# Patient Record
Sex: Female | Born: 1935 | Race: White | Hispanic: No | Smoking: Never smoker
Health system: Southern US, Community
[De-identification: ages and names within clinical notes are randomized; demographics above are authoritative.]

## PROBLEM LIST (undated history)

## (undated) DIAGNOSIS — K219 Gastro-esophageal reflux disease without esophagitis: Secondary | ICD-10-CM

## (undated) DIAGNOSIS — F0390 Unspecified dementia without behavioral disturbance: Secondary | ICD-10-CM

## (undated) DIAGNOSIS — I10 Essential (primary) hypertension: Secondary | ICD-10-CM

## (undated) DIAGNOSIS — E785 Hyperlipidemia, unspecified: Secondary | ICD-10-CM

## (undated) DIAGNOSIS — E079 Disorder of thyroid, unspecified: Secondary | ICD-10-CM

## (undated) HISTORY — PX: ABDOMINAL HYSTERECTOMY: SHX81

## (undated) HISTORY — PX: BACK SURGERY: SHX140

## (undated) HISTORY — PX: APPENDECTOMY: SHX54

## (undated) HISTORY — PX: ANKLE FUSION: SHX881

---

## 1997-10-02 ENCOUNTER — Ambulatory Visit (HOSPITAL_COMMUNITY): Admission: RE | Admit: 1997-10-02 | Discharge: 1997-10-02 | Payer: Self-pay | Admitting: *Deleted

## 1997-10-14 ENCOUNTER — Other Ambulatory Visit: Admission: RE | Admit: 1997-10-14 | Discharge: 1997-10-14 | Payer: Self-pay | Admitting: *Deleted

## 1997-10-14 ENCOUNTER — Ambulatory Visit (HOSPITAL_COMMUNITY): Admission: RE | Admit: 1997-10-14 | Discharge: 1997-10-14 | Payer: Self-pay | Admitting: *Deleted

## 1997-11-18 ENCOUNTER — Other Ambulatory Visit: Admission: RE | Admit: 1997-11-18 | Discharge: 1997-11-18 | Payer: Self-pay | Admitting: *Deleted

## 1998-01-14 ENCOUNTER — Ambulatory Visit (HOSPITAL_COMMUNITY): Admission: RE | Admit: 1998-01-14 | Discharge: 1998-01-14 | Payer: Self-pay | Admitting: Gastroenterology

## 1998-09-07 ENCOUNTER — Other Ambulatory Visit: Admission: RE | Admit: 1998-09-07 | Discharge: 1998-09-07 | Payer: Self-pay | Admitting: *Deleted

## 1998-11-02 ENCOUNTER — Ambulatory Visit (HOSPITAL_COMMUNITY): Admission: RE | Admit: 1998-11-02 | Discharge: 1998-11-02 | Payer: Self-pay | Admitting: *Deleted

## 1998-11-09 ENCOUNTER — Other Ambulatory Visit: Admission: RE | Admit: 1998-11-09 | Discharge: 1998-11-09 | Payer: Self-pay | Admitting: *Deleted

## 1998-11-11 ENCOUNTER — Ambulatory Visit (HOSPITAL_BASED_OUTPATIENT_CLINIC_OR_DEPARTMENT_OTHER): Admission: RE | Admit: 1998-11-11 | Discharge: 1998-11-11 | Payer: Self-pay | Admitting: Plastic Surgery

## 1999-02-18 ENCOUNTER — Ambulatory Visit (HOSPITAL_COMMUNITY): Admission: RE | Admit: 1999-02-18 | Discharge: 1999-02-18 | Payer: Self-pay | Admitting: *Deleted

## 1999-07-26 ENCOUNTER — Encounter: Payer: Self-pay | Admitting: Emergency Medicine

## 1999-07-26 ENCOUNTER — Emergency Department (HOSPITAL_COMMUNITY): Admission: EM | Admit: 1999-07-26 | Discharge: 1999-07-26 | Payer: Self-pay | Admitting: Emergency Medicine

## 1999-08-10 ENCOUNTER — Encounter: Admission: RE | Admit: 1999-08-10 | Discharge: 1999-08-10 | Payer: Self-pay | Admitting: Gastroenterology

## 1999-08-10 ENCOUNTER — Encounter: Payer: Self-pay | Admitting: Gastroenterology

## 1999-11-03 ENCOUNTER — Ambulatory Visit (HOSPITAL_COMMUNITY): Admission: RE | Admit: 1999-11-03 | Discharge: 1999-11-03 | Payer: Self-pay | Admitting: *Deleted

## 1999-11-09 ENCOUNTER — Other Ambulatory Visit: Admission: RE | Admit: 1999-11-09 | Discharge: 1999-11-09 | Payer: Self-pay | Admitting: *Deleted

## 1999-11-15 ENCOUNTER — Ambulatory Visit (HOSPITAL_COMMUNITY): Admission: RE | Admit: 1999-11-15 | Discharge: 1999-11-15 | Payer: Self-pay | Admitting: *Deleted

## 2000-11-08 ENCOUNTER — Ambulatory Visit (HOSPITAL_COMMUNITY): Admission: RE | Admit: 2000-11-08 | Discharge: 2000-11-08 | Payer: Self-pay | Admitting: Internal Medicine

## 2000-11-08 ENCOUNTER — Encounter: Payer: Self-pay | Admitting: Internal Medicine

## 2001-01-02 ENCOUNTER — Ambulatory Visit (HOSPITAL_COMMUNITY): Admission: RE | Admit: 2001-01-02 | Discharge: 2001-01-02 | Payer: Self-pay | Admitting: Internal Medicine

## 2001-01-02 ENCOUNTER — Encounter: Payer: Self-pay | Admitting: Internal Medicine

## 2001-11-12 ENCOUNTER — Ambulatory Visit (HOSPITAL_COMMUNITY): Admission: RE | Admit: 2001-11-12 | Discharge: 2001-11-12 | Payer: Self-pay | Admitting: Internal Medicine

## 2001-11-12 ENCOUNTER — Encounter: Payer: Self-pay | Admitting: Internal Medicine

## 2002-11-14 ENCOUNTER — Encounter: Payer: Self-pay | Admitting: Internal Medicine

## 2002-11-14 ENCOUNTER — Ambulatory Visit (HOSPITAL_COMMUNITY): Admission: RE | Admit: 2002-11-14 | Discharge: 2002-11-14 | Payer: Self-pay | Admitting: Internal Medicine

## 2003-11-18 ENCOUNTER — Ambulatory Visit (HOSPITAL_COMMUNITY): Admission: RE | Admit: 2003-11-18 | Discharge: 2003-11-18 | Payer: Self-pay | Admitting: Internal Medicine

## 2004-06-15 ENCOUNTER — Other Ambulatory Visit: Admission: RE | Admit: 2004-06-15 | Discharge: 2004-06-15 | Payer: Self-pay | Admitting: Internal Medicine

## 2004-11-18 ENCOUNTER — Ambulatory Visit (HOSPITAL_COMMUNITY): Admission: RE | Admit: 2004-11-18 | Discharge: 2004-11-18 | Payer: Self-pay | Admitting: Internal Medicine

## 2005-01-20 ENCOUNTER — Ambulatory Visit: Admission: RE | Admit: 2005-01-20 | Discharge: 2005-01-20 | Payer: Self-pay | Admitting: Urology

## 2005-03-29 ENCOUNTER — Inpatient Hospital Stay (HOSPITAL_COMMUNITY): Admission: RE | Admit: 2005-03-29 | Discharge: 2005-03-30 | Payer: Self-pay | Admitting: Urology

## 2005-11-21 ENCOUNTER — Ambulatory Visit (HOSPITAL_COMMUNITY): Admission: RE | Admit: 2005-11-21 | Discharge: 2005-11-21 | Payer: Self-pay | Admitting: Internal Medicine

## 2006-04-06 ENCOUNTER — Inpatient Hospital Stay (HOSPITAL_COMMUNITY): Admission: EM | Admit: 2006-04-06 | Discharge: 2006-04-11 | Payer: Self-pay

## 2006-04-11 ENCOUNTER — Ambulatory Visit: Payer: Self-pay | Admitting: Physical Medicine & Rehabilitation

## 2006-04-11 ENCOUNTER — Inpatient Hospital Stay (HOSPITAL_COMMUNITY)
Admission: RE | Admit: 2006-04-11 | Discharge: 2006-05-05 | Payer: Self-pay | Admitting: Physical Medicine & Rehabilitation

## 2006-04-12 ENCOUNTER — Encounter (INDEPENDENT_AMBULATORY_CARE_PROVIDER_SITE_OTHER): Payer: Self-pay | Admitting: Cardiology

## 2006-07-13 ENCOUNTER — Ambulatory Visit (HOSPITAL_COMMUNITY): Admission: RE | Admit: 2006-07-13 | Discharge: 2006-07-13 | Payer: Self-pay | Admitting: Geriatric Medicine

## 2006-07-13 ENCOUNTER — Encounter (INDEPENDENT_AMBULATORY_CARE_PROVIDER_SITE_OTHER): Payer: Self-pay | Admitting: *Deleted

## 2006-11-06 ENCOUNTER — Inpatient Hospital Stay (HOSPITAL_COMMUNITY): Admission: EM | Admit: 2006-11-06 | Discharge: 2006-11-10 | Payer: Self-pay | Admitting: Emergency Medicine

## 2006-11-07 ENCOUNTER — Encounter (INDEPENDENT_AMBULATORY_CARE_PROVIDER_SITE_OTHER): Payer: Self-pay | Admitting: Cardiology

## 2007-01-10 ENCOUNTER — Encounter: Admission: RE | Admit: 2007-01-10 | Discharge: 2007-01-10 | Payer: Self-pay | Admitting: Geriatric Medicine

## 2007-01-18 ENCOUNTER — Inpatient Hospital Stay (HOSPITAL_COMMUNITY): Admission: RE | Admit: 2007-01-18 | Discharge: 2007-01-22 | Payer: Self-pay | Admitting: Orthopedic Surgery

## 2007-02-22 ENCOUNTER — Ambulatory Visit (HOSPITAL_COMMUNITY): Admission: RE | Admit: 2007-02-22 | Discharge: 2007-02-22 | Payer: Self-pay | Admitting: Gastroenterology

## 2008-01-16 ENCOUNTER — Encounter: Admission: RE | Admit: 2008-01-16 | Discharge: 2008-01-16 | Payer: Self-pay | Admitting: Geriatric Medicine

## 2009-08-04 ENCOUNTER — Encounter: Admission: RE | Admit: 2009-08-04 | Discharge: 2009-08-04 | Payer: Self-pay | Admitting: Geriatric Medicine

## 2010-05-16 ENCOUNTER — Encounter: Payer: Self-pay | Admitting: Geriatric Medicine

## 2010-07-13 ENCOUNTER — Other Ambulatory Visit: Payer: Self-pay | Admitting: Geriatric Medicine

## 2010-07-13 DIAGNOSIS — Z1231 Encounter for screening mammogram for malignant neoplasm of breast: Secondary | ICD-10-CM

## 2010-08-06 ENCOUNTER — Ambulatory Visit
Admission: RE | Admit: 2010-08-06 | Discharge: 2010-08-06 | Disposition: A | Discharge status: Home / Self Care | Payer: Medicare Other | Source: Ambulatory Visit | Attending: Geriatric Medicine | Admitting: Geriatric Medicine

## 2010-08-06 DIAGNOSIS — Z1231 Encounter for screening mammogram for malignant neoplasm of breast: Secondary | ICD-10-CM

## 2010-09-07 NOTE — Op Note (Signed)
NAMEJOELLYN, GRANDT NO.:  [id number]   MEDICAL RECORD NO.:  [id number]          PATIENT TYPE:  INP   LOCATION:  1618                         FACILITY:  [facility]   PHYSICIAN:  Leonides Grills, M.D.     DATE OF BIRTH:  09-06-1927   DATE OF PROCEDURE:  01/18/2007  DATE OF DISCHARGE:                               OPERATIVE REPORT   PREOPERATIVE DIAGNOSES:  1. Left ankle arthritis.  2. Left tight Achilles tendon.   POSTOPERATIVE DIAGNOSES:  1. Left ankle arthritis.  2. Left tight Achilles tendon.   OPERATION:  1. Left ankle fusion.  2. Left fibular osteotomy.  3. Left local bone graft.  4. Left percutaneous tendo Achilles lengthening.   ANESTHESIA:  General.   SURGEON:  Leonides Grills, M.D.   ASSISTANT:  Evlyn Kanner, P.A.   ESTIMATED BLOOD LOSS:  Minimal.   TOURNIQUET TIME:  Approximately hour and a half.   COMPLICATIONS:  None.   DISPOSITION:  Stable to PR.   INDICATIONS:  This is a 77 year old female who has had longstanding left  ankle pain with equinus deformity that is interfering with her life to  the point she can not do what she wants to do.  She has consented to the  above procedure.  All risks which include infection, nerve or vessel  injury, nonunion, malunion, hardware irrigation, hardware failure,  persistent pain, worse pain, prolonged recovery, stiffness, arthritis of  juxta-articular joints were all explained questions were encouraged and  answered.   OPERATION:  Patient brought to the operating placed in supine position.  After adequate general endotracheal anesthesia was administered as well  as Ancef 1 gram IV piggyback.  A bump was placed on the left ipsilateral  hip, internally rotating left lower extremity.  The left lower extremity  was prepped, draped sterile manner over proximally placed thigh  tourniquet.  The limb was gravity exsanguinated.  Tourniquet elevated to  290 mmHg.  A longitudinal incision over the lateral aspect  of the  lateral malleolus then made.  Dissection was carried down through skin  to bone.  Hemostasis was obtained.  Soft tissue was elevated on the  anterior aspect of the fibula.  A fibular osteotomy was then performed  with a sagittal saw and a block of bone was then removed approximately 1  cm.  The syndesmosis was taken down and the fibula was actually rotated.  The joint was ankylosed in equinus position.  This was carefully freed  up.  The anterior distal tibial spur was then removed with the curved  quarter inch osteotome and this was placed in the back table as local  bone graft.  The remaining osteophytes also removed and bone from the  drill were also used as local bone graft as well.  We then entered the  joint and removed the remaining cartilage which was minimal.  We then  decorticated both the tibia and talus surfaces using a fish scale  technique and released the posterior capsule with a wooden handled  elevator.  Once the posterior capsule was released, we were able to  dorsiflex the ankle  and performed percutaneous tendo Achilles  lengthening with two medial lateral hemisections of the Achilles tendon.  This had excellent release of the tight Achilles tendon.  We then  dorsiflexed the ankle to a neutral position and then provisionally fixed  this with a 2.0 mm K-wire.  We then verified this under C-arm guidance  to be in excellent position.  We then through a medial incision placed a  6.5 mm 16 mm threaded cancellus screw using 4.5, 3.2 mm drill holes  respectively.  This had excellent purchase and maintenance of the  correction.  We then removed a 2.0 mm K-wire and replaced this with a  6.5 mm 16 mm threaded cancellous screw using 4.5, 3.2 mm drill holes  respectively.  This had excellent purchase and maintenance of the  correction.  We then further osteotomized the fibula distally with a  sagittal saw.  We then placed multiple 2-mm drill holes on the lateral  aspect of  the tibia once the soft tissue was removed and the remaining  cartilage off the lateral facet of the talus.  We then placed [employer]  Infuse bone graft with RHBMP 2ACS  2.8 mm  in the lateral aspect of the  ankle in the interface between the biological plate, i.e. lateral  malleolus and the tibia talus respectively.  We also placed local bone  graft in this area as well.  We then applied biological plate and then  put two 6.5 mm 16 mm threaded cancellous screws one with 16 mm, one with  32 mm threads.  We did this with two 4.5, 3.2 mm drill holes  respectively.  This excellent purchase and compression across the fusion  site.  We obtained stress x-rays AP lateral mortise views. This showed  no gross motion across fusion site, fixation, proper position excellent  alignment as well.  No violation subtalar joint, subtalar joint had  excellent motion without any impingement.  Prior to Infuse bone graft  placement the area was copiously irrigated normal saline.  Stress strain  relieving graft was applied to the ankle joint itself.  Tourniquet  deflated.  Hemostasis was obtained.  There was palpable dorsalis pedis  pulse.  There was no pulsatile bleeding.  Subcu was closed 3-0 Vicryl.  Skin was closed 4-0 nylon.  Sterile dressing was applied.  Modified  Jones dressing was applied. The patient was stable to PR.      Leonides Grills, M.D.  Electronically Signed     PB/MEDQ  D:  01/18/2007  T:  01/19/2007  Job:  [id number]

## 2010-09-07 NOTE — Op Note (Signed)
NAMEMARLISA, [name]:  [id number]   MEDICAL RECORD NO.:  [id number]          PATIENT TYPE:  AMB   LOCATION:  ENDO                         FACILITY:  [facility]   PHYSICIAN:  Bernette Redbird, M.D.   DATE OF BIRTH:  Dec 02, 1927   DATE OF PROCEDURE:  02/22/2007  DATE OF DISCHARGE:                               OPERATIVE REPORT   PROCEDURE:  Upper endoscopy, Savary dilatation of the esophagus under  fluoroscopy.   INDICATION:  A 77 year old female with recurrent dysphagia symptoms now  six months status post esophageal dilatation to 18 mm of a Schatzki's  ring.   FINDINGS:  Esophageal ring, re-dilated to 18 mm.   PROCEDURE:  The nature, purpose and risks of the procedure were familiar  to the patient from prior examinations and she provided written consent.  Sedation was per anesthesia and included fentanyl, Versed and propofol.  Pentax video endoscope was passed under direct vision.  The vocal cords  looked normal.  The esophagus was readily entered and had entirely  normal mucosa without evidence of free reflux, reflux esophagitis,  Barrett's esophagus, varices infection, neoplasia.  There was a well-  defined esophageal mucosal ring at the squamocolumnar junction below  which was a 2 cm hiatal hernia.  The ring offered no resistance to  passage of the 10 mm endoscope.   The stomach contained a small amount of bile reflux, but was otherwise  normal and did not show evidence of bile reflux gastritis.  No erosions,  ulcers, polyps or masses were seen in the stomach and a retroflexed view  of the cardia was normal.  The pylorus, duodenal bulb and second  duodenum similarly were normal.   Savary dilatation was then performed in the standard fashion, with  running the scope into the antrum of the stomach, inserting the spring-  tip guide wire, removing the scope in an exchange fashion, confirming  appropriate positioning of the wire with fluoroscopy, and making a  single passage with an 18 mm Savary dilator which seemed to meet a fair  amount resistance on the bite block, but once it was going down the  esophagus, it did not seem to encounter any discrete resistance  distally.  The dilator was removed and did not have any visible blood on  it.  The guidewire was removed with the dilator.  The patient was free  endoscoped under direct vision which showed a fracture of the ring with  a small amount of fresh hemorrhage but no evidence of active significant  for ongoing bleeding and no endoscopic evidence of perforation or undue  trauma to the stomach, esophagus or hypopharynx.   The patient tolerated the procedure well and there no apparent  complications.   IMPRESSION:  Schatzki's ring above small hiatal hernia, dilated to 18 mm  by Savary technique under fluoroscopy as described above.   PLAN:  Clinical follow-up of dysphagia symptoms.           ______________________________  Bernette Redbird, M.D.     RB/MEDQ  D:  02/22/2007  T:  02/23/2007  Job:  [id number]   cc:  Hal T. Stoneking, M.D.  Fax: [phone]
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
## 2010-09-07 NOTE — Consult Note (Signed)
[name], MCCONATHY NO.:  [id number]   MEDICAL RECORD NO.:  [id number]          PATIENT TYPE:  INP   LOCATION:  6526                         FACILITY:  [facility]   PHYSICIAN:  Armanda Magic, M.D.     DATE OF BIRTH:  04/15/1928   DATE OF CONSULTATION:  11/06/2006  DATE OF DISCHARGE:                                 CONSULTATION   REFERRING PHYSICIAN:  Michelene Gardener, MD   PRIMARY CARE PHYSICIAN:  Hal T. Stoneking, M.D.   CHIEF COMPLAINT:  Chest pain.   HISTORY OF PRESENT ILLNESS:  This is a 77 year old female with no  history of coronary disease but a history of dyslipidemia,  hypothyroidism and questionable dementia who was admitted on July 13  with chest pain.  Apparently that evening she developed tremors and  nausea and then felt pain in the mid chest area radiating into her back.  She described it as a knife-like pain.  She thinks might have been worse  with inspiration.  She was short of breath with it but no diaphoresis or  vomiting.  She was given several aspirin and sublingual nitroglycerin  with relief of chest pain.  She currently is pain free.   PAST MEDICAL HISTORY:  Includes:  1. Hypothyroidism.  2. Dyslipidemia.  3. Questionable dementia.   PAST SURGICAL HISTORY:  1. Status post appendectomy.  2. Status post hysterectomy.  3. Status post bilateral cataract extraction.  4. Status post back surgery.   FAMILY HISTORY:  Mother died of colon cancer.  Her father died of heart  disease.  He had diabetes.   SOCIAL HISTORY:  She is widowed.  She has no children.  She denies any  tobacco or alcohol use.  She lives in the [address].   ALLERGIES:  She has no known drug allergies.   MEDICATIONS:  1. Aricept 5 mg daily.  2. Levothyroxine 88 mcg daily.  3. Mylanta 15 mL p.o. p.r.n.  4. Vitamin C 500 mg a day.  5. ProAir 90 mcg 2 puffs twice daily.  6. Nasonex 1 spray twice daily.  7. Estradiol 1 mg a day.  8. Ecotrin 325 mg a day.  9.  Oxybutynin 10 mg nightly.  10.Desyrel 100 mg at bedtime.  11.Benzonatate 3 times a day as needed for cough./  12.Tylenol p.r.n.  13.Mirapex 0.125 mg p.o. nightly for restless leg.  14.Lasix 80 mg daily which was recently started for lower extremity      edema.   REVIEW OF SYSTEMS:  Otherwise is negative.   PHYSICAL EXAMINATION:  VITAL SIGNS:  Blood pressure is 124/39, heart  rate 55.  GENERAL:  This is a well-developed, well-nourished female in no acute  distress.  HEENT:  Benign.  NECK:  Supple without lymphadenopathy.  Carotid upstrokes +2 bilaterally  with no bruits.  LUNGS:  Clear to auscultation throughout.  Regular rate and rhythm.  HEART:  No murmurs, rubs or gallops.  S1, S2.  ABDOMEN:  Benign.  EXTREMITIES:  No edema.  Plus 2 dorsalis pedis pulses bilaterally.   LABORATORY DATA:  Sodium was 131, potassium 4.3, chloride  102, bicarb  25, BUN 13, creatinine 0.73. White cell count 8.3, hematocrit 35,  hemoglobin 11.8, platelet count 294 CPK 66, MB 3.9, troponin 0.46.  Point-of-care markers:  Troponin 0.13 and 0.12 with negative CPK and  myoglobin. D-dimer 1.45.   Chest CT showed no evidence of pulmonary embolism.  There were chronic  lung changes with scarring at the bases. There was mild cardiac  enlargement and coronary calcifications.   ASSESSMENT:  1. Chest pain syndrome with positive troponin but negative CPK-MB of      unclear significance.  The patient has difficulty describing her      symptoms.  A chest CT did show some coronary artery calcifications.      Her EKG is nonischemic.  She does have some frequent PACs.  2. Dyslipidemia.  3. Hypothyroidism.   PLAN:  Will recheck cardiac enzymes. If the troponins continue to trend  upward,  will plan on cardiac catheterization. Otherwise, stress  Cardiolite the morning.  We will also check a 2-D echocardiogram to make  sure LV function is normal.  If her LV function appears down or  decreased, then we will plan  cardiac catheterization as well.  Would  recommend in the interim to increase Lovenox per pharmacy protocol to 1  mg/k and continue nitrates and aspirin.      Armanda Magic, M.D.  Electronically Signed     TT/MEDQ  D:  11/06/2006  T:  11/06/2006  Job:  [id number]   cc:   Hal T. Stoneking, M.D.

## 2010-09-07 NOTE — H&P (Signed)
NAMEJENINE, [name]:  [id number]   MEDICAL RECORD NO.:  [id number]          PATIENT TYPE:  INP   LOCATION:  6526                         FACILITY:  [facility]   PHYSICIAN:  Barnetta Chapel, MDDATE OF BIRTH:  1927-09-21   DATE OF ADMISSION:  11/05/2006  DATE OF DISCHARGE:                              HISTORY & PHYSICAL   PRIMARY CARE PHYSICIAN:  Hal T. Stoneking, M.D.   CHIEF COMPLAINT:  Chest pain.   HISTORY OF PRESENTING COMPLAINT:  The patient is a 77 year old female with  past medical history significant for hyperlipidemia, hypothyroidism, and  edema of the lower extremities.  There is also a questionable history of  dementia.  The patient developed severe chest pain last night after  having dinner.  The pain was said to be radiating to the backside.  The  pain was so severe that the patient had to call 9-1-1.  No associated  nausea or vomiting.  No diaphoresis. The 9-1-1 staff gave the patient  Nitro and some aspirin and this helped the pain significantly.  The  patient has been relatively chest-pain free since her admission to the  ER.  On presentation to the ER, the troponin was 0.12, and the D-dimer  was elevated.  A CT of the chest was negative for any PE.  However, it  showed atelectasis of the left lower lobe.  No headache, no neck pain,  no GI symptoms and no urinary symptoms.   PAST MEDICAL HISTORY:  Essentially as above.   MEDICATIONS PRIOR TO ADMISSION:  1. Aricept.  2. Levothroid.  3. Mylanta.  4. Vitamin C.  5. __________.  6. Estradiol.  7. Ecotrin.  8. Dobutamine.  9. Desyrel.  10.Benzonatate.  11.Tylenol.  12.Mirapex.  13.Lasix.   ALLERGIES:  None known.   SOCIAL HISTORY:  The patient was married to a physician.  The patient is  a widow.  She has no children.  She denied any use of alcohol,  cigarettes, or illicit substances.   FAMILY HISTORY:  Noncontributory.   REVIEW OF SYSTEMS:  Essentially as above.   PHYSICAL  EXAMINATION:  VITAL SIGNS:  Reveals a temperature 98.6, blood  pressure of 115/55, heart rate of 60, respiratory rate of 18, and O2  saturation of 95%.  GENERAL CONDITION:  The patient is not in any real distress.  HEENT:  No pallor, no jaundice.  Sternocleidomastoid muscle movements  are intact.  NECK:  Supple.  There is no real JVD, and no lymphadenopathy.  LUNGS:  Clear to auscultation.  CARDIOVASCULAR:  S1-S2, no heart murmur appreciated.  ABDOMEN:  Obese, soft, and nontender.  Bowel sounds are present.  NEUROLOGIC:  Exam is nonfocal.  EXTREMITIES:  No edema.   LABS:  Done so far, the troponin is 0.012.  WBC of 8.3, hemoglobin of  11.8, hematocrit of 35, platelet count of 294, and MCV of 90.7.  Sodium  121, potassium 4.3, chloride 102, CO2 of 25, BUN of 13 and a creatinine  0.7 with a blood sugar of 92.   IMPRESSION:  1. Chest pain.  2. Suspect unstable angina (acute  coronary syndrome).  3. Dyslipidemia.  4. History of hypothyroidism.   PLAN:  Will admit the patient to the telemetry floor.  We will work the  patient up for possible acute coronary syndrome.  We will continue  aspirin.  We will continue nitro pack, we will use subacute Lovenox and  an Ace inhibitor.  The heart rate is in the lower 60s and an upper 50s;  therefore, we will hold the beta blocker for now.  We will get a  cardiology consult most likely in the morning (by the physician making  rounds).  We will followup cardiac panel as per protocol.  Further  management will depend on hospital course.      Barnetta Chapel, MD  Electronically Signed     SIO/MEDQ  D:  11/06/2006  T:  11/06/2006  Job:  [id number]   cc:   Hal T. Stoneking, M.D.

## 2010-09-07 NOTE — H&P (Signed)
NAMEANWEN, CANNEDY NO.:  [id number]   MEDICAL RECORD NO.:  [id number]          PATIENT TYPE:  INP   LOCATION:  1618                         FACILITY:  [facility]   PHYSICIAN:  Leonides Grills, M.D.     DATE OF BIRTH:  Oct 01, 1927   DATE OF ADMISSION:  01/18/2007  DATE OF DISCHARGE:                              HISTORY & PHYSICAL   HISTORY OF PRESENT ILLNESS:  Ms. Perri is a 77 year old female with  a long standing history of left ankle arthritis with equinus deformity  that comes today for planned surgical correction of her foot.  This has  been going on for 7 years and has gotten progressively worse to the  point where it is interfering with her life and the things that she  wants to do.  It is almost severe everyday and she can barely walk and  does ambulate with a painful limp.  She will be admitted today after  surgery for PT and OT consultation and nursing home placement.   PAST MEDICAL HISTORY:  Significant for hyperthyroidism, atrial  fibrillation, hypertension, hyperlipidemia, and insomnia.   CURRENT MEDICATIONS:  1. Estradiol 1 mg daily.  2. Lasix 40 mg daily.  3. Potassium supplements 20 mEq daily.  4. Levothroid 88 mcg daily.  5. Metoprolol 25 mg daily.  6. Omeprazole 20 mg 2 daily.  7. Requip 1 mg nightly.  8. Oxybutynin 10 mg nightly.  9. Aricept 5 mg nightly.  10.Trazodone 100 mg nightly.  11.Vitamin C 500 mg daily.  12.Proventil inhaler 2 puffs every 4-6 hours p.r.n.   ALLERGIES:  No known drug allergies.   SOCIAL HISTORY:  She does not smoke.  She is retired.   PHYSICAL EXAMINATION:  VITAL SIGNS:  Stable.  GENERAL:  This patient is a well-developed, well-nourished female lying  supine in the bed in no acute distress.  SKIN:  Warm and dry.  HEENT:  Unremarkable.  NECK:  Supple, nontender, there are no carotid bruits.  LUNGS:  Clear to auscultation bilaterally without evidence of rales,  rhonchi or wheezes.  CARDIAC:  Reveals an  irregular rate and rhythm consistent with atrial  fib.  There is S1, S2 present, no rubs, murmurs or gallops.  ABDOMEN:  Soft, nontender, bowel sounds are present in all four  quadrants.  Peripheral pulses are 3+ bilaterally upper and lower  extremities.  She is neurovascularly intact.  NEURO:  Nonfocal.  EXTREMITIES:  Her left ankle has chronic swelling and degenerative  deformities.  She is autofused, she has no ankle range of motion, she  has decreased subtalar range of motion.  She is actually in a plantar  flexion deformity.  She is neurovascularly intact distal.   CLINICAL IMPRESSION:  Left ankle arthritis with equinus deformity.   PLAN:  We are going to take Ms. Dusing to surgery today where we are  going to perform a left ankle fusion with fibular osteotomy and local  bone graft.  We are also going to do a percutaneous Achilles tendon  lengthening.  She will be sent to PACU where she will be admitted to the  hospital for pain management, gait training, and Social Work consult for  nursing home placement for a brief stent of rehab.      Evlyn Kanner, P.A.      Leonides Grills, M.D.  Electronically Signed    RA/MEDQ  D:  01/18/2007  T:  01/19/2007  Job:  [id number]

## 2010-09-07 NOTE — Discharge Summary (Signed)
[name], [name]:  [id number]   MEDICAL RECORD NO.:  [id number]          PATIENT TYPE:  INP   LOCATION:  1618                         FACILITY:  [facility]   PHYSICIAN:  Leonides Grills, M.D.     DATE OF BIRTH:  May 15, 1927   DATE OF ADMISSION:  01/18/2007  DATE OF DISCHARGE:                               DISCHARGE SUMMARY   HISTORY OF PRESENT ILLNESS:  The patient is a 77 year old female with a  longstanding history of arthritis in her left ankle that was interfering  with her daily activities and inhibiting her to do the things that she  would like to do. She was brought in to the hospital on 01/18/07, for  surgery.   HOSPITAL COURSE:  During that surgery a left ankle fusion and left  percutaneous Achilles tendon lengthening were performed. She had an  extended stay due to the fact that she is in an assisted living facility  and was going to require a rehab where she would have to go to a higher  level of care so that prolonged her stay in the hospital.   While she was in the hospital she was seen by physical therapy who  cleared her for ambulation with a 4-point walker and also started some  of the strengthening exercises.   Initially, on her first labs when she was in the hospital, she had a  potassium that was 5.5. Her potassium supplements were held on the 26th.  Her K had normalized to 4.6. Her blood glucoses ran between 100 and 120  during her hospital stay.   Her pain was controlled while she was in the hospital. She did have some  moments of confusion, but with prompting she would have a return to her  normal mentation. Her vital signs are stable. She was afebrile during  her whole stay.   CLINICAL IMPRESSION:  Four days status post left ankle fusion and  percutaneous Achilles tendon lengthening.   PLAN:  We are going to see her back in the office in 2 weeks at which  time we will remove the dressing. During the meantime, she is to leave  her  dressing on, clean, dry and intact. Nonweightbearing using a walker.  She is going to have elevation at a level greater than the level of her  heart for the next few days, wiggling her toes. Gave her a  prescription for Vicodin 5 mg 1 every 4 to 6 hours p.r.n., pain. Robaxin  500 mg q. 8 hours p.r.n., spasms. Aspirin 325 twice a day. She is going  to be discharged the [facility] to the rehab facility. She has a  brief stay, and once she is strong enough, she can go back to her  independent living.      Evlyn Kanner, P.A.      Leonides Grills, M.D.  Electronically Signed    RA/MEDQ  D:  01/22/2007  T:  01/22/2007  Job:  [id number]   cc:   Hal T. Stoneking, M.D.  Fax: [phone]

## 2010-09-10 NOTE — Op Note (Signed)
[name], [name]:  [id number]   MEDICAL RECORD NO.:  [id number]          PATIENT TYPE:  INP   LOCATION:  1419                         FACILITY:  [facility]   PHYSICIAN:  Martina Sinner, MD DATE OF BIRTH:  January 15, 1928   DATE OF PROCEDURE:  03/29/2005  DATE OF DISCHARGE:                                 OPERATIVE REPORT   PREOPERATIVE DIAGNOSIS:  Vault prolapse with enterocele, rectocele,  cystocele, and mixed stress and urge incontinence.   POSTOPERATIVE DIAGNOSIS:  Vault prolapse with enterocele, rectocele,  cystocele, and mixed stress and urge incontinence.   SURGERY:  Vaginal vault prolapse with repair of enterocele and rectocele,  utilizing the Apogee dermal graft plus cystocele repair, perineal repair,  and cystoscopy.   [name] has a symptomatic vaginal vault prolapse with probable  enterocele, large rectocele, and small cystocele.  She has a tendency to  have retention, and I was going to initially do a very loose SPARC sling.   She was prepped and draped in the usual fashion.  Extra care was taken to  minimize the risk of compartment syndrome or neuropathy.  She was prepped  very well, utilizing a sterile drape under the buttock, allowing [place] to  perform an Apogee later.   On initial examination, she appeared to have an apical enterocele that was  probably anterior as well as mostly posterior.  It was quite high, but it  was almost like a small thumb at the apex.  She then had a very large  posterior defect, which by digital rectal examination intraoperatively was a  large, attenuated rectocele and not an enterocele.  She had a very short  anterior vaginal wall.  She had very impressive angulation of the anterior  vaginal wall at the level of the ureterovesical angle relative to the  urethra, and I thought she would have been at very high risk of having  retention if I did a sling.  This anatomy was greatly improved at the end of  the  apical Apogee repair, but because of her age and risk factors for  retention and tendency for high residuals, I decided not to perform a sling.  I thought it was best not to address her stress incontinence aggressively in  fear of causing retention since her main treatment goal was her prolapse.  I  initially made a T-shaped anterior vaginal wall incision, leaving 2 cm  proximal to the dimples and the thumb-sized, pooching enterocele.  I  mobilized the vaginal mucosa from the pubocervical fascia.  I used a Lobbyist.  She had a very short anterior vaginal wall.  Based upon her  anatomy, I did not feel she was a good candidate for putting in a graft  anteriorly.  I did a two layer, 2-0 Vicryl running suture.  I then  cystoscoped her, and she had efflux of indigo carmine from both ureteral  orifices.  The floor of the bladder was nicely supported endoscopically.  The rest of the cystoscopic examination was normal with no stitch foreign  body or carcinoma.  I did not trim any  anterior vaginal wall, and I closed  it with running 2-0 Vicryl suture.   I had made two 1 cm incisions one fingerbreadth above the symphysis pubis  bilaterally, thinking I was going to perform a SPARC, but I decided not to  because of the fear of retention.  I therefore at the end of the case closed  these incisions with a 4-0 Vicryl and some Dermabond.   I then directed my attention to the posterior defect between two Allis  clamps at the hymenal ring.  I removed a triangle of perineum.  I fulgurated  any bleeders.  I then made a long midline posterior vaginal wall incision to  approximately 2 cm from the vaginal cuff.  I sharply dissected the vaginal  mucosa from the underlying rectovaginal fascia.  I dissected it laterally.  I used Allis clamps and then the Environmental consultant.  She had very floppy,  loose tissue in the perineum and the rectum and a lot of redundant vaginal  wall mucosa as well as very thin,  attenuated rectovaginal fascia with a lot  of bulging.  I was able to take this down bilaterally.  Carefully, I took  down what appeared to be an enterocele that was a thumb-like defect down off  the apex, allowing me later to sew a graft to this area.  I did not open the  probable enterocele.   Sharply and then bluntly, I broke through the rectal pillars bilaterally and  could easily feel the spines bilaterally.  With a Brooke retractor at a  right angle, I could identify the spines bilaterally, the rectum medially,  and the ileococcygeus levator muscle laterally.   I then made two stab incisions 3 cm lateral and 3 cm posterior to the  midline of the rectum.  This was done sterilely.  With the usual technique,  I then brought the Apogee needle through the perineal incisions, through the  ischial rectal fossa, and then I walked up underneath the ileococcygeus  muscle just to the ischial spine bilaterally.  I was very happy with the  replacements of the needle.  I did a digital rectal examination, and the  rectum was not entered.  I then attached the Apogee dermal sling, using a  tonsil bilaterally, and brought the arms out through most of their length,  without tearing the ileococcygeus.  I then trimmed the more cephalad part of  the dermal graft and sewed it with four interrupted 3-0 Vicryls through full  thickness vaginal wall mucosa at the apex, covering any potential enterocele  or apical defect.  I then sewed the graft without tension laterally to  rectovaginal fascia, approximately midway along the graft.  I trimmed the  distal end, which would be later sewn to the perineal repair.   I closed the anterior vaginal wall mucosa with running 2-0 Vicryl  approximately 50% of the length.  I then took away my small hooks and  trimmed the proximally 2 cm of posterior vaginal wall mucosa bilaterally.  I did this because there was so much redundancy.  I then reduced, without  pulling hard,  the vaginal vault towards each spine laterally and gently  tugged on the straps bilaterally.  I was very happy with the placement.  I  was very happy with the vaginal length.  I then cut below the blue dots  bilaterally.  I then finished the posterior vaginal wall closure.   I then sewed the distal end of the graft  to the perineal repair with two  interrupted 3-0 Vicryls, not under tension.  I then used two interrupted 0  Vicryls as an embrocation perineal repair.  This lengthened the perineal  body.  I then finished the posterior vaginal wall closure and then  incorporated the 2-0 Vicryl in a subcuticular fascia to close the perineal  skin incision.   At the end of the case, there was no narrowing.  There was excellent vaginal  length.  The perineal body had been lengthened.   Blood loss was approximately 200 ml.  Copious irrigation was utilized.  The  two stab incisions in the buttock were closed with interrupted 4-0 Vicryl  and Dermabond.  A vaginal pack was inserted.  A Foley catheter was draining  blue urine at the end of the case.   The patient's legs were in good position at the end of the case.  She was  very stable.  She was taken to the recovery room.           ______________________________  Martina Sinner, MD  Electronically Signed     SAM/MEDQ  D:  03/29/2005  T:  03/29/2005  Job:  [id number]
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
## 2010-09-10 NOTE — Consult Note (Signed)
NAMESHANTESE, RAVEN NO.:  [id number]   MEDICAL RECORD NO.:  [id number]            PATIENT TYPE:   LOCATION:                                 FACILITY:   PHYSICIAN:  Antonietta Breach, M.D.       DATE OF BIRTH:   DATE OF CONSULTATION:  04/26/2006  DATE OF DISCHARGE:                                 CONSULTATION   CONSULTATION FOLLOWUP   [name] has been displaying moderate paranoia at times.  It waxes  and wanes.  She refused the physical examination by the physical  rehabilitation physician this week on 1 of its visits.  She would not  tolerate the undersigned evaluating her mental status to any depth.   She does continue to maintain interests in music and she denies  depressed mood.  She is not having any adverse Aricept effects.   The undersigned was asked to return to assess her capacity and recommend  ongoing treatment.   EXAMINATION:  VITAL SIGNS:  Temperature 97.0.  Pulse 72.  Respirations  20.  Blood pressure 100/60.  O2 saturation 91%.   MENTAL STATUS EXAM:  [name] has a irritable affect.  She is short  tempered and begins to curse the undersigned demanding that he leave the  room.  She grossly displays short term memory impairment.  She does not  understand the necessity of evaluating her ability to make decisions  prior to confronting critical decisions that she will be making as part  of her necessary treatment course and managing her assets, as well as  decisions about placement and environment.  Her judgment is impaired.  Her insight is poor.  Her thought process is mostly coherent.  She is  not combative, but rings the call bell asking for the nurse to remove  the undersigned from her room.   ASSESSMENT:  294.9 unspecified mental disorder, not otherwise specified.  Dementia, not otherwise specified.  293.81 psychotic disorder, not otherwise specified.  The patient does  have a significant amount of paranoia, which can undermined  her ability  to cooperate with health care personnel and proceed through a decision  process.   RECOMMENDATIONS:  1. Would consider adding Seroquel if the paranoia persists, starting      at 25 mg q.h.s. and increasing by 25-50 mg per day to an      approximate effective dose of 100 to 150 mg q.h.s.  Small doses of      the total daily dose can be added during the day, such as 25 mg      b.i.d. for antiacute agitation.  2. Continue the Aricept trial for memory improvement.  3. Discussion:  [name] demonstrates critical deficits in memory      and judgment.  She does not have the capacity for informed consent.      Antonietta Breach, M.D.  Electronically Signed     JW/MEDQ  D:  04/27/2006  T:  04/27/2006  Job:  [id number]
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
## 2010-09-10 NOTE — Discharge Summary (Signed)
[name], [name]:  [id number]   MEDICAL RECORD NO.:  [id number]          PATIENT TYPE:  OUT   LOCATION:  DFTL                         FACILITY:  [facility]   PHYSICIAN:  Martina Sinner, MD DATE OF BIRTH:  1927-11-16   DATE OF ADMISSION:  01/20/2005  DATE OF DISCHARGE:  01/20/2005                                 DISCHARGE SUMMARY   ADMISSION DIAGNOSES:  1.  Vaginal vault prolapse.  2.  Rectocele.  3.  Enterocele.  4.  Cystocele.  5.  Stress incontinence.   SURGERY:  [place], vault suspension, repair of enterocele, rectocele, and  cystocele including sling lift urethropexy and cystoscopy.   On December 5, [name] underwent a vault suspension with enterocele  repair and A&P repair.  The surgery went very well, and blood loss was less  than 250 mL.  [name] was admitted overnight for routine observation.  She was covered with perioperative Unasyn and gentamycin.  Her pain was well  controlled.  Her vaginal pack was taken out the next day and had a  successful trial of voiding.  She was kept on all her home medications  excluding aspirin.  Her hemoglobin on day of discharge was 10.7 and was 10.9  preoperatively.   [name] went home with the usual instructions, and my nurse will call  her postoperatively.  She will be followed in clinic as per protocol.           ______________________________  Martina Sinner, MD  Electronically Signed     SAM/MEDQ  D:  04/14/2005  T:  04/17/2005  Job:  [id number]

## 2010-11-23 ENCOUNTER — Emergency Department (HOSPITAL_COMMUNITY)
Admission: EM | Admit: 2010-11-23 | Discharge: 2010-11-23 | Disposition: A | Discharge status: Home / Self Care | Payer: Medicare Other | Attending: Emergency Medicine | Admitting: Emergency Medicine

## 2010-11-23 ENCOUNTER — Emergency Department (HOSPITAL_COMMUNITY): Payer: Medicare Other

## 2010-11-23 DIAGNOSIS — F068 Other specified mental disorders due to known physiological condition: Secondary | ICD-10-CM | POA: Insufficient documentation

## 2010-11-23 DIAGNOSIS — M545 Low back pain, unspecified: Secondary | ICD-10-CM | POA: Insufficient documentation

## 2010-11-23 DIAGNOSIS — I10 Essential (primary) hypertension: Secondary | ICD-10-CM | POA: Insufficient documentation

## 2010-11-23 DIAGNOSIS — I4891 Unspecified atrial fibrillation: Secondary | ICD-10-CM | POA: Insufficient documentation

## 2010-11-23 DIAGNOSIS — N390 Urinary tract infection, site not specified: Secondary | ICD-10-CM | POA: Insufficient documentation

## 2010-11-23 DIAGNOSIS — E0590 Thyrotoxicosis, unspecified without thyrotoxic crisis or storm: Secondary | ICD-10-CM | POA: Insufficient documentation

## 2010-11-23 DIAGNOSIS — E785 Hyperlipidemia, unspecified: Secondary | ICD-10-CM | POA: Insufficient documentation

## 2010-11-23 DIAGNOSIS — R110 Nausea: Secondary | ICD-10-CM | POA: Insufficient documentation

## 2010-11-23 DIAGNOSIS — M25559 Pain in unspecified hip: Secondary | ICD-10-CM | POA: Insufficient documentation

## 2010-11-23 LAB — URINALYSIS, ROUTINE W REFLEX MICROSCOPIC
Bilirubin Urine: NEGATIVE
Ketones, ur: NEGATIVE mg/dL
Nitrite: POSITIVE — AB
Urobilinogen, UA: 0.2 mg/dL (ref 0.0–1.0)

## 2010-11-23 LAB — URINE MICROSCOPIC-ADD ON

## 2010-11-25 LAB — URINE CULTURE
Colony Count: >=100000
Culture  Setup Time: 201207312010

## 2011-02-03 LAB — APTT: aPTT: 25

## 2011-02-03 LAB — CBC
HCT: 35.1 — ABNORMAL LOW
Hemoglobin: 11.8 — ABNORMAL LOW
MCV: 88.7
Platelets: 191
RBC: 3.96
WBC: 8.9

## 2011-02-03 LAB — BASIC METABOLIC PANEL
BUN: 14
BUN: 18
Chloride: 102
Chloride: 106
Creatinine, Ser: 0.94
Glucose, Bld: 100 — ABNORMAL HIGH
Potassium: 4.6
Sodium: 136

## 2011-02-03 LAB — URINALYSIS, ROUTINE W REFLEX MICROSCOPIC
Bilirubin Urine: NEGATIVE
Glucose, UA: NEGATIVE
Hgb urine dipstick: NEGATIVE
Ketones, ur: NEGATIVE
Specific Gravity, Urine: 1.008
pH: 7.5

## 2011-02-03 LAB — HEMOGLOBIN AND HEMATOCRIT, BLOOD: Hemoglobin: 13.2

## 2011-02-07 LAB — DIFFERENTIAL
Basophils Absolute: 0
Basophils Absolute: 0
Eosinophils Relative: 0
Lymphocytes Relative: 5 — ABNORMAL LOW
Lymphs Abs: 0.7
Lymphs Abs: 0.9
Monocytes Relative: 3
Monocytes Relative: 5
Neutro Abs: 15.8 — ABNORMAL HIGH
Neutrophils Relative %: 92 — ABNORMAL HIGH

## 2011-02-07 LAB — CBC
HCT: 34.3 — ABNORMAL LOW
HCT: 35.4 — ABNORMAL LOW
HCT: 36.7
Hemoglobin: 11.3 — ABNORMAL LOW
Hemoglobin: 11.6 — ABNORMAL LOW
Hemoglobin: 12
MCHC: 33.8
MCV: 91.6
Platelets: 272
Platelets: 279
RBC: 3.71 — ABNORMAL LOW
RBC: 3.89
RDW: 13.1
RDW: 13.2
RDW: 13.3
WBC: 12.4 — ABNORMAL HIGH
WBC: 16.9 — ABNORMAL HIGH
WBC: 17.6 — ABNORMAL HIGH

## 2011-02-07 LAB — BASIC METABOLIC PANEL
BUN: 12
Calcium: 8.3 — ABNORMAL LOW
Calcium: 8.6
Chloride: 99
Creatinine, Ser: 0.94
Creatinine, Ser: 0.96
GFR calc Af Amer: >60
GFR calc non Af Amer: 56 — ABNORMAL LOW
Glucose, Bld: 75
Sodium: 130 — ABNORMAL LOW

## 2011-02-07 LAB — CULTURE, BLOOD (ROUTINE X 2)

## 2011-02-07 LAB — URINE CULTURE: Colony Count: >=100000

## 2011-02-07 LAB — URINALYSIS, ROUTINE W REFLEX MICROSCOPIC
Bilirubin Urine: NEGATIVE
Nitrite: POSITIVE — AB
Protein, ur: 100 — AB
Specific Gravity, Urine: 1.01
Urobilinogen, UA: 1

## 2011-02-08 LAB — CARDIAC PANEL(CRET KIN+CKTOT+MB+TROPI)
CK, MB: 3.8
Relative Index: 3 — ABNORMAL HIGH
Total CK: 67
Troponin I: 0.3 — ABNORMAL HIGH

## 2011-02-08 LAB — POCT CARDIAC MARKERS
CKMB, poc: 1.6
Myoglobin, poc: 58.6
Operator id: 277751
Troponin i, poc: 0.12 — ABNORMAL HIGH
Troponin i, poc: 0.13 — ABNORMAL HIGH

## 2011-02-08 LAB — COMPREHENSIVE METABOLIC PANEL
ALT: 23
AST: 33
CO2: 25
Calcium: 8.5
GFR calc Af Amer: >60
Potassium: 4.3
Sodium: 131 — ABNORMAL LOW
Total Protein: 5.8 — ABNORMAL LOW

## 2011-02-08 LAB — DIFFERENTIAL
Eosinophils Absolute: 0.1
Eosinophils Relative: 1
Lymphs Abs: 0.4 — ABNORMAL LOW
Monocytes Relative: 1 — ABNORMAL LOW

## 2011-02-08 LAB — LIPID PANEL
Cholesterol: 133
HDL: 54
LDL Cholesterol: 72
Total CHOL/HDL Ratio: 2.5
Triglycerides: 36
VLDL: 7

## 2011-02-08 LAB — CK TOTAL AND CKMB (NOT AT ARMC)
CK, MB: 3.9
Relative Index: INVALID
Total CK: 66

## 2011-02-08 LAB — CBC
MCHC: 33.8
RBC: 3.86 — ABNORMAL LOW

## 2011-02-08 LAB — TROPONIN I: Troponin I: 0.46 — ABNORMAL HIGH

## 2011-07-04 ENCOUNTER — Other Ambulatory Visit (HOSPITAL_COMMUNITY): Payer: Self-pay | Admitting: Geriatric Medicine

## 2011-07-04 DIAGNOSIS — Z1231 Encounter for screening mammogram for malignant neoplasm of breast: Secondary | ICD-10-CM

## 2011-08-08 ENCOUNTER — Ambulatory Visit (HOSPITAL_COMMUNITY): Payer: Medicare Other

## 2011-08-16 ENCOUNTER — Ambulatory Visit
Admission: RE | Admit: 2011-08-16 | Discharge: 2011-08-16 | Disposition: A | Discharge status: Home / Self Care | Payer: Medicare Other | Source: Ambulatory Visit | Attending: Geriatric Medicine | Admitting: Geriatric Medicine

## 2011-08-16 DIAGNOSIS — Z1231 Encounter for screening mammogram for malignant neoplasm of breast: Secondary | ICD-10-CM

## 2012-07-19 ENCOUNTER — Other Ambulatory Visit: Payer: Self-pay | Admitting: Geriatric Medicine

## 2012-07-19 DIAGNOSIS — Z1231 Encounter for screening mammogram for malignant neoplasm of breast: Secondary | ICD-10-CM

## 2012-08-22 ENCOUNTER — Ambulatory Visit
Admission: RE | Admit: 2012-08-22 | Discharge: 2012-08-22 | Disposition: A | Discharge status: Home / Self Care | Payer: Medicare Other | Source: Ambulatory Visit | Attending: Geriatric Medicine | Admitting: Geriatric Medicine

## 2012-08-22 DIAGNOSIS — Z1231 Encounter for screening mammogram for malignant neoplasm of breast: Secondary | ICD-10-CM

## 2013-10-08 ENCOUNTER — Other Ambulatory Visit: Payer: Self-pay | Admitting: Geriatric Medicine

## 2013-10-08 DIAGNOSIS — Z1231 Encounter for screening mammogram for malignant neoplasm of breast: Secondary | ICD-10-CM

## 2013-10-24 ENCOUNTER — Ambulatory Visit
Admission: RE | Admit: 2013-10-24 | Discharge: 2013-10-24 | Disposition: A | Discharge status: Home / Self Care | Payer: Medicare Other | Source: Ambulatory Visit | Attending: Geriatric Medicine | Admitting: Geriatric Medicine

## 2013-10-24 DIAGNOSIS — Z1231 Encounter for screening mammogram for malignant neoplasm of breast: Secondary | ICD-10-CM

## 2014-02-09 ENCOUNTER — Encounter (HOSPITAL_COMMUNITY): Payer: Self-pay | Admitting: Emergency Medicine

## 2014-02-09 ENCOUNTER — Emergency Department (HOSPITAL_COMMUNITY)
Admission: EM | Admit: 2014-02-09 | Discharge: 2014-02-09 | Disposition: A | Discharge status: Home / Self Care | Payer: Medicare Other | Attending: Emergency Medicine | Admitting: Emergency Medicine

## 2014-02-09 DIAGNOSIS — F0390 Unspecified dementia without behavioral disturbance: Secondary | ICD-10-CM | POA: Diagnosis not present

## 2014-02-09 DIAGNOSIS — Z79899 Other long term (current) drug therapy: Secondary | ICD-10-CM | POA: Diagnosis not present

## 2014-02-09 DIAGNOSIS — Z7982 Long term (current) use of aspirin: Secondary | ICD-10-CM | POA: Diagnosis not present

## 2014-02-09 DIAGNOSIS — I10 Essential (primary) hypertension: Secondary | ICD-10-CM | POA: Diagnosis not present

## 2014-02-09 DIAGNOSIS — E785 Hyperlipidemia, unspecified: Secondary | ICD-10-CM | POA: Insufficient documentation

## 2014-02-09 DIAGNOSIS — Z0000 Encounter for general adult medical examination without abnormal findings: Secondary | ICD-10-CM | POA: Insufficient documentation

## 2014-02-09 DIAGNOSIS — K219 Gastro-esophageal reflux disease without esophagitis: Secondary | ICD-10-CM | POA: Diagnosis not present

## 2014-02-09 DIAGNOSIS — Z008 Encounter for other general examination: Secondary | ICD-10-CM | POA: Diagnosis present

## 2014-02-09 DIAGNOSIS — E039 Hypothyroidism, unspecified: Secondary | ICD-10-CM | POA: Insufficient documentation

## 2014-02-09 HISTORY — DX: Gastro-esophageal reflux disease without esophagitis: K21.9

## 2014-02-09 HISTORY — DX: Essential (primary) hypertension: I10

## 2014-02-09 HISTORY — DX: Disorder of thyroid, unspecified: E07.9

## 2014-02-09 HISTORY — DX: Hyperlipidemia, unspecified: E78.5

## 2014-02-09 HISTORY — DX: Unspecified dementia, unspecified severity, without behavioral disturbance, psychotic disturbance, mood disturbance, and anxiety: F03.90

## 2014-02-09 NOTE — ED Notes (Signed)
[facility] no answer. Patient had normal assessment [facility].
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
## 2014-02-09 NOTE — Discharge Instructions (Signed)
It was our pleasure to provide your ER care today. ° °Return to ER if worse, new symptoms, fevers, other concern.  °

## 2014-02-09 NOTE — ED Provider Notes (Signed)
CSN: [id number]   [id number]  Arrival date & time 02/09/14  0915 History   First MD Initiated Contact with Patient 02/09/14 [phone]     Chief Complaint  Patient presents with  . Evaluation      (Consider location/radiation/quality/duration/timing/severity/associated sxs/prior Treatment) The history is provided by the patient and the EMS personnel.  pt [facility] via [facility] from [facility], as staff had noted that patient was more difficult to awaken from sleep this morning. ems notes upon their arrival pt was awake and alert, w normal mental status.  Pt denies any c/o. States feels fine/well. No headache. No cp or sob. No cough or uri c/o. Normal appetite. No abd pain. No nvd. No dysuria or gu c/o. No numbness/weakness. Pt denies recent change in meds or new meds. No fever or chills.       Past Medical History  Diagnosis Date  . Hyperlipidemia   . Thyroid disease     Hypo  . Hypertension   . Dementia   . GERD (gastroesophageal reflux disease)    Past Surgical History  Procedure Laterality Date  . Ankle fusion Left   . Back surgery    . Abdominal hysterectomy    . Appendectomy     No family history on file. History  Substance Use Topics  . Smoking status: Never Smoker   . Smokeless tobacco: Not on file  . Alcohol Use: No   OB History   Grav Para Term Preterm Abortions TAB SAB Ect Mult Living                 Review of Systems  Constitutional: Negative for fever and chills.  HENT: Negative for sore throat.   Eyes: Negative for visual disturbance.  Respiratory: Negative for shortness of breath.   Cardiovascular: Negative for chest pain.  Gastrointestinal: Negative for vomiting, abdominal pain and diarrhea.  Endocrine: Negative for polyuria.  Genitourinary: Negative for dysuria and flank pain.  Musculoskeletal: Negative for back pain and neck pain.  Skin: Negative for rash.  Neurological: Negative for weakness, numbness and headaches.  Hematological: Does not bruise/bleed easily.   Psychiatric/Behavioral: Negative for confusion.      Allergies  Review of patient's allergies indicates no known allergies.  Home Medications   Prior to Admission medications   Medication Sig Start Date End Date Taking? Authorizing Provider  ascorbic acid (VITAMIN C) 500 MG tablet Take 500 mg by mouth 2 (two) times daily.   Yes Historical Provider, MD  aspirin EC 81 MG tablet Take 81 mg by mouth daily.   Yes Historical Provider, MD  calcium citrate-vitamin D (CITRACAL+D) 315-200 MG-UNIT per tablet Take 1 tablet by mouth daily.   Yes Historical Provider, MD  docusate sodium (COLACE) 100 MG capsule Take 200 mg by mouth daily.   Yes Historical Provider, MD  levothyroxine (SYNTHROID, LEVOTHROID) 100 MCG tablet Take 100 mcg by mouth daily before breakfast.   Yes Historical Provider, MD  memantine (NAMENDA) 10 MG tablet Take 10 mg by mouth 2 (two) times daily.   Yes Historical Provider, MD  metoprolol succinate (TOPROL-XL) 25 MG 24 hr tablet Take 25 mg by mouth daily.   Yes Historical Provider, MD  omeprazole (PRILOSEC) 20 MG capsule Take 20 mg by mouth daily.   Yes Historical Provider, MD  polyethylene glycol (MIRALAX / GLYCOLAX) packet Take 17 g by mouth daily.   Yes Historical Provider, MD   BP 112/79  Pulse 86  Temp(Src) 98.3 F (36.8 C) (Oral)  Resp 20  SpO2 97% Physical Exam  Nursing note and vitals reviewed. Constitutional: She is oriented to person, place, and time. She appears well-developed and well-nourished. No distress.  HENT:  Head: Atraumatic.  Mouth/Throat: Oropharynx is clear and moist.  Eyes: Conjunctivae are normal. Pupils are equal, round, and reactive to light. No scleral icterus.  Neck: Neck supple. No tracheal deviation present. No thyromegaly present.  No bruit  Cardiovascular: Normal rate, regular rhythm, normal heart sounds and intact distal pulses.   Pulmonary/Chest: Effort normal and breath sounds normal. No respiratory distress.  Abdominal: Soft. Normal  appearance and bowel sounds are normal. She exhibits no distension. There is no tenderness.  Genitourinary:  No cva tenderness  Musculoskeletal: She exhibits no edema and no tenderness.  Neurological: She is alert and oriented to person, place, and time. No cranial nerve deficit.  Motor intact bil, stre 5/5, sens intact.   Skin: Skin is warm and dry. No rash noted. She is not diaphoretic.  Psychiatric: She has a normal mood and affect.    ED Course  Procedures (including critical care time) Labs Review   MDM   Reviewed nursing notes and prior charts for additional history.   Pt remains completely asymptomatic, and is fully awake and alert, has normal vital signs, and requests d/c back to her home/facility.  Pt currently is asymptomatic and appears stable for d/c.       Suzi RootsKevin E Tandy Lewin, MD 02/09/14 1010

## 2014-02-09 NOTE — ED Notes (Signed)
Pt sts that she feels fine. Pt has no complaints. Pt is alert to self, DOB, location and month, but unaware of the year. Pt in NAD

## 2014-09-24 ENCOUNTER — Other Ambulatory Visit: Payer: Self-pay

## 2014-09-24 DIAGNOSIS — Z1231 Encounter for screening mammogram for malignant neoplasm of breast: Secondary | ICD-10-CM

## 2014-10-31 ENCOUNTER — Ambulatory Visit
Admission: RE | Admit: 2014-10-31 | Discharge: 2014-10-31 | Disposition: A | Discharge status: Home / Self Care | Payer: Medicare Other | Source: Ambulatory Visit

## 2014-10-31 DIAGNOSIS — Z1231 Encounter for screening mammogram for malignant neoplasm of breast: Secondary | ICD-10-CM

## 2014-11-03 ENCOUNTER — Other Ambulatory Visit: Payer: Self-pay | Admitting: Geriatric Medicine

## 2014-11-03 DIAGNOSIS — R928 Other abnormal and inconclusive findings on diagnostic imaging of breast: Secondary | ICD-10-CM

## 2014-11-10 ENCOUNTER — Ambulatory Visit
Admission: RE | Admit: 2014-11-10 | Discharge: 2014-11-10 | Disposition: A | Discharge status: Home / Self Care | Payer: Medicare Other | Source: Ambulatory Visit | Attending: Geriatric Medicine | Admitting: Geriatric Medicine

## 2014-11-10 DIAGNOSIS — R928 Other abnormal and inconclusive findings on diagnostic imaging of breast: Secondary | ICD-10-CM

## 2015-05-25 ENCOUNTER — Ambulatory Visit (INDEPENDENT_AMBULATORY_CARE_PROVIDER_SITE_OTHER): Payer: Medicare Other | Admitting: Sports Medicine

## 2015-05-25 ENCOUNTER — Encounter: Payer: Self-pay | Admitting: Sports Medicine

## 2015-05-25 ENCOUNTER — Ambulatory Visit (INDEPENDENT_AMBULATORY_CARE_PROVIDER_SITE_OTHER): Payer: Medicare Other

## 2015-05-25 VITALS — BP 107/57 | HR 64 | Resp 16

## 2015-05-25 DIAGNOSIS — M21619 Bunion of unspecified foot: Secondary | ICD-10-CM | POA: Diagnosis not present

## 2015-05-25 DIAGNOSIS — I739 Peripheral vascular disease, unspecified: Secondary | ICD-10-CM

## 2015-05-25 DIAGNOSIS — M2041 Other hammer toe(s) (acquired), right foot: Secondary | ICD-10-CM

## 2015-05-25 DIAGNOSIS — M79676 Pain in unspecified toe(s): Secondary | ICD-10-CM

## 2015-05-25 DIAGNOSIS — B351 Tinea unguium: Secondary | ICD-10-CM

## 2015-05-25 DIAGNOSIS — M79671 Pain in right foot: Secondary | ICD-10-CM | POA: Diagnosis not present

## 2015-05-25 NOTE — Progress Notes (Signed)
Patient ID: [name], female   DOB: [birth year], 82 y.o.   MRN: [id number] Subjective: [name] is a 82 y.o. female patient seen today in office with complaint of painful thickened and elongated toenails; unable to trim ans bunion and hammertoe. Patient is assisted by nursing from living facility. Patient denies history of known Diabetes, Neuropathy, or Vascular disease. Patient has no other pedal complaints at this time.   There are no active problems to display for this patient.  Current Outpatient Prescriptions on File Prior to Visit  Medication Sig Dispense Refill  . ascorbic acid (VITAMIN C) 500 MG tablet Take 500 mg by mouth 2 (two) times daily.    Marland Kitchen aspirin EC 81 MG tablet Take 81 mg by mouth daily.    . calcium citrate-vitamin D (CITRACAL+D) 315-200 MG-UNIT per tablet Take 1 tablet by mouth daily.    Marland Kitchen docusate sodium (COLACE) 100 MG capsule Take 200 mg by mouth daily.    Marland Kitchen levothyroxine (SYNTHROID, LEVOTHROID) 100 MCG tablet Take 100 mcg by mouth daily before breakfast.    . memantine (NAMENDA) 10 MG tablet Take 10 mg by mouth 2 (two) times daily.    . metoprolol succinate (TOPROL-XL) 25 MG 24 hr tablet Take 25 mg by mouth daily.    Marland Kitchen omeprazole (PRILOSEC) 20 MG capsule Take 20 mg by mouth daily.    . polyethylene glycol (MIRALAX / GLYCOLAX) packet Take 17 g by mouth daily.     No current facility-administered medications on file prior to visit.   No Known Allergies   Objective: Physical Exam  General: Well developed, nourished, no acute distress, awake, alert and oriented x 2  Vascular: Dorsalis pedis artery 1/4 bilateral, Posterior tibial artery 1/4 bilateral, skin temperature warm to warm proximal to distal bilateral lower extremities, + varicosities and trace edema bilateral, scant pedal hair present bilateral.  Neurological: Gross sensation present via light touch bilateral. Vibratory diminished to toes bilateral.   Dermatological: Skin is warm, dry, and  supple bilateral, Nails 1-10 are tender, long, thick, and discolored with mild subungal debris, no webspace macerations present bilateral, no open lesions present bilateral, no callus/corns/hyperkeratotic tissue present bilateral. No signs of infection bilateral.  Musculoskeletal: Asymptomatic bunion and hammertoe deformities noted bilateral. Muscular strength within normal limits without pain or limitation on range of motion. No pain with calf compression bilateral.  Xray: Right foot: Decreased osseous mineralization, Bunion and hammertoe deformity without fracture or dislocation, soft tissues within normal limits, No foreign body.  Assessment and Plan:  Problem List Items Addressed This Visit    None    Visit Diagnoses    Right foot pain    -  Primary    Relevant Orders    DG Foot 2 Views Right    Dermatophytosis of nail        Hammertoe, right        Bunion        PVD (peripheral vascular disease) (HCC)          -Examined patient.  -X-rays reviewed -Discussed treatment options for painful mycotic nails, bunion and hammertoe -Recommend good supportive shoes daily for foot type -Recommend lower extremity elevation to assist with edema control while sitting -Mechanically debrided and reduced mycotic nails with sterile nail nipper and dremel nail file without incident. -Patient to return as needed for follow up evaluation or sooner if symptoms worsen.  Asencion Islam, DPM

## 2015-07-24 ENCOUNTER — Other Ambulatory Visit: Payer: Self-pay | Admitting: Geriatric Medicine

## 2015-07-24 DIAGNOSIS — R4182 Altered mental status, unspecified: Secondary | ICD-10-CM

## 2015-07-31 ENCOUNTER — Ambulatory Visit
Admission: RE | Admit: 2015-07-31 | Discharge: 2015-07-31 | Disposition: A | Discharge status: Home / Self Care | Payer: Medicare Other | Source: Ambulatory Visit | Attending: Geriatric Medicine | Admitting: Geriatric Medicine

## 2015-07-31 DIAGNOSIS — R4182 Altered mental status, unspecified: Secondary | ICD-10-CM

## 2015-09-24 ENCOUNTER — Other Ambulatory Visit: Payer: Self-pay | Admitting: Geriatric Medicine

## 2015-09-24 DIAGNOSIS — R410 Disorientation, unspecified: Secondary | ICD-10-CM

## 2015-09-30 ENCOUNTER — Ambulatory Visit
Admission: RE | Admit: 2015-09-30 | Discharge: 2015-09-30 | Disposition: A | Discharge status: Home / Self Care | Payer: Medicare Other | Source: Ambulatory Visit | Attending: Geriatric Medicine | Admitting: Geriatric Medicine

## 2015-09-30 DIAGNOSIS — R410 Disorientation, unspecified: Secondary | ICD-10-CM

## 2015-11-24 DEATH — deceased

## 2018-03-16 IMAGING — CT CT HEAD W/O CM
2 series · 15 of 38 positions shown, 18 images · non-contrast
Comparison: July 31, 2015

CLINICAL DATA: Recent fall.  Confusion since fall

EXAM:
CT HEAD WITHOUT CONTRAST
TECHNIQUE: Contiguous axial images were obtained from the base of the skull
through the vertex without intravenous contrast.

[Series 2: head w/(date) · axial · 0.41mm/px · z∈[-157,-42]mm · 12 of 28 slices shown, 15 images]
[im 3/28  brain]
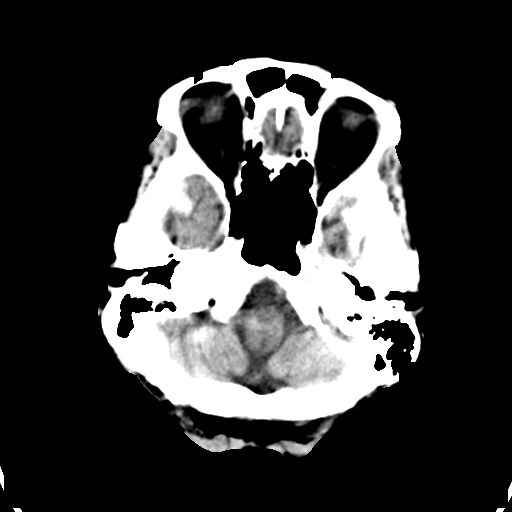
[im 3/28  bone]
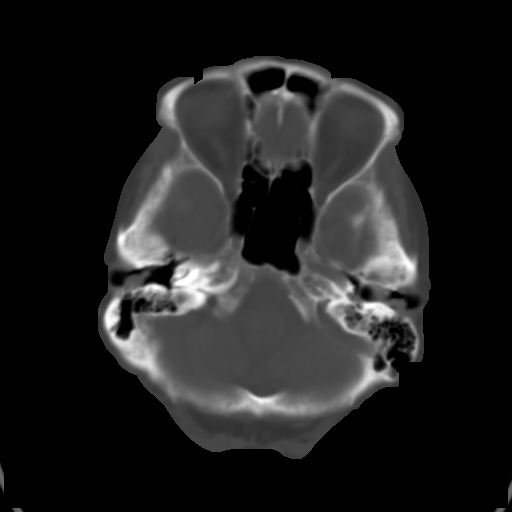
[im 5/28  brain]
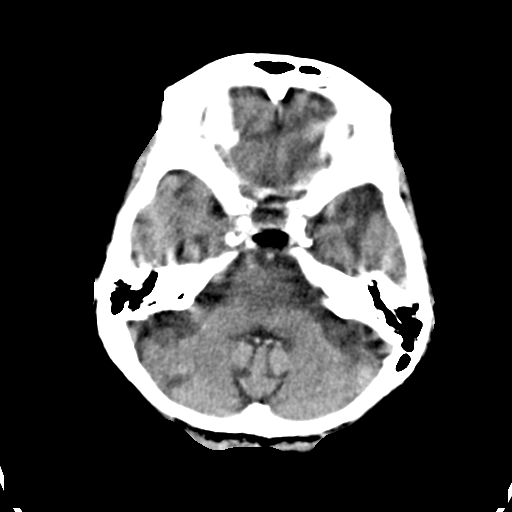
[im 7/28  brain]
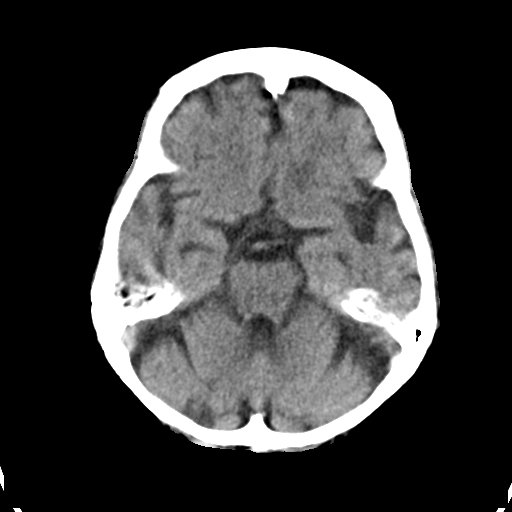
[im 9/28  brain]
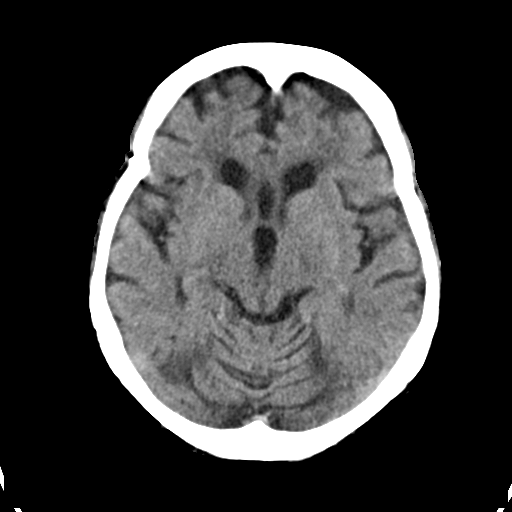
[im 11/28  brain]
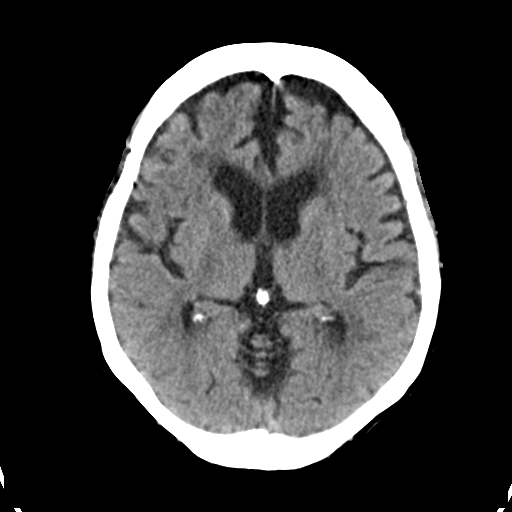
[im 11/28  bone]
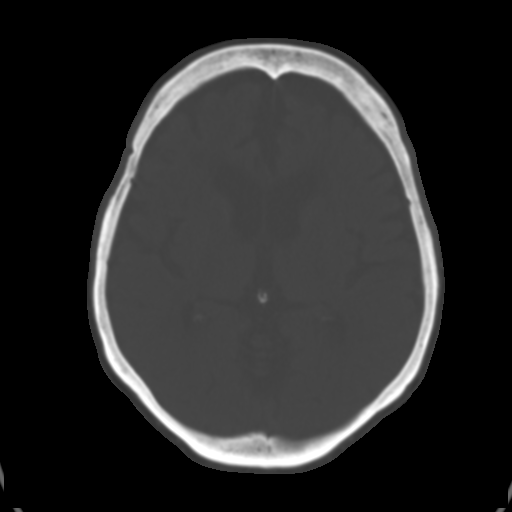
[im 13/28  brain]
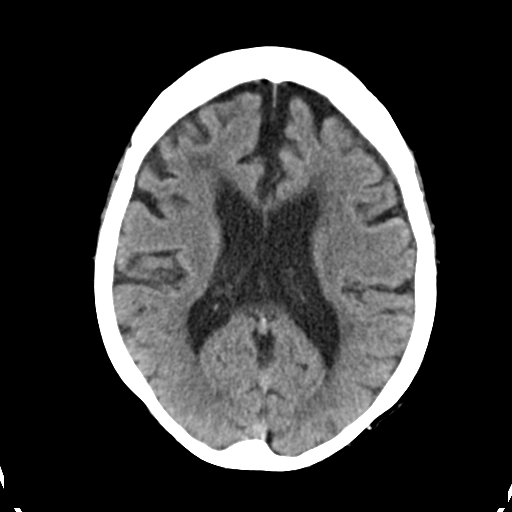
[im 16/28  brain]
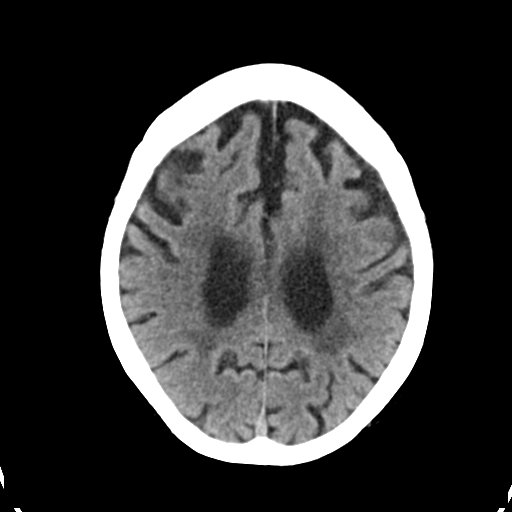
[im 18/28  brain]
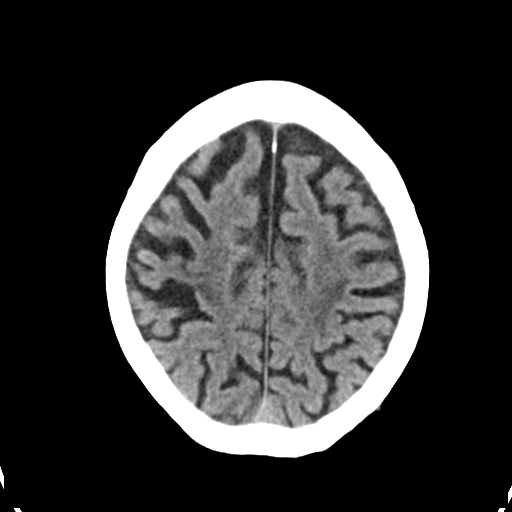
[im 20/28  brain]
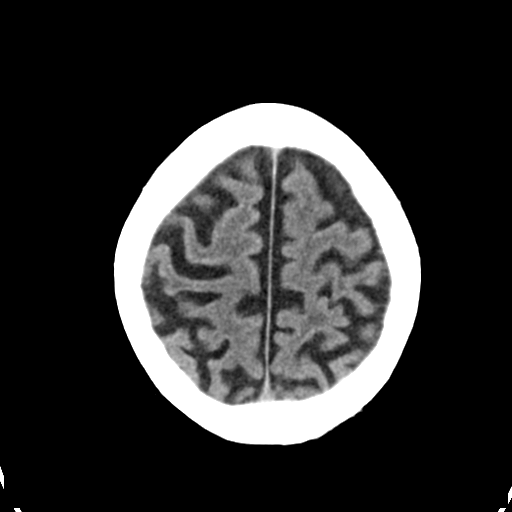
[im 20/28  bone]
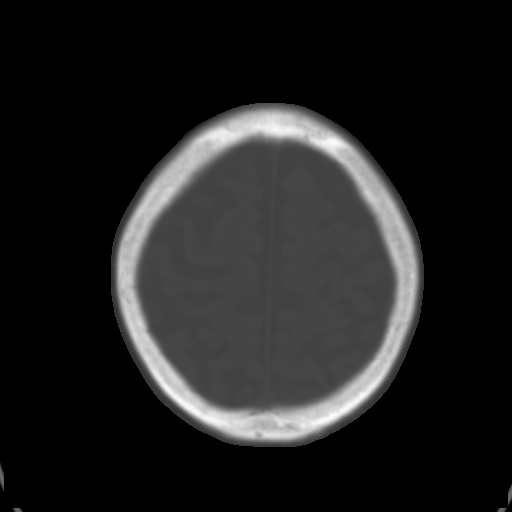
[im 22/28  brain]
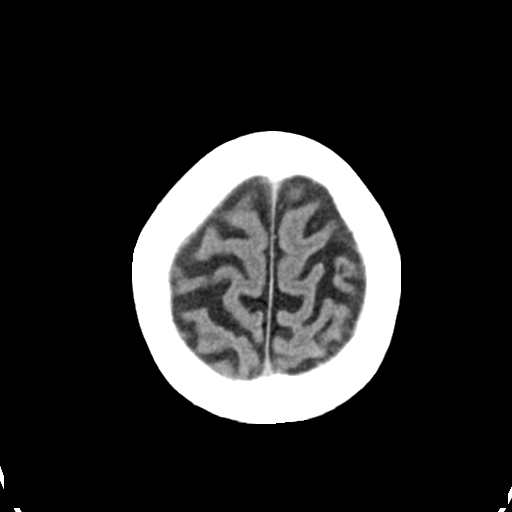
[im 24/28  brain]
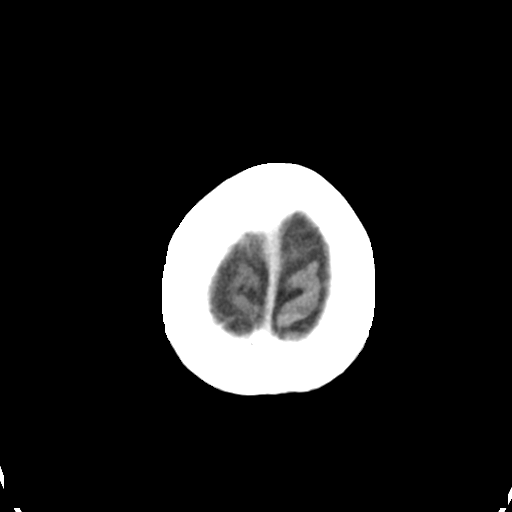
[im 26/28  brain]
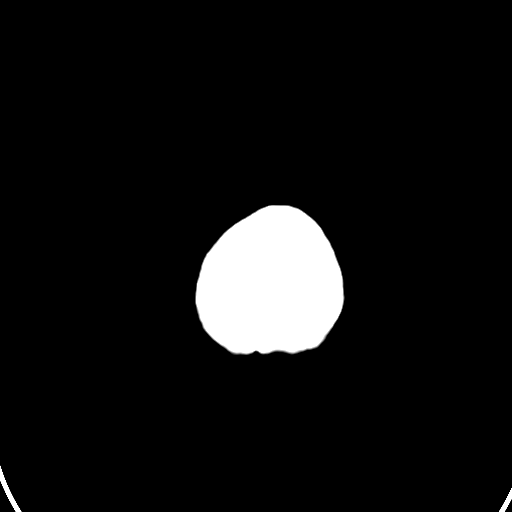

[Series 602: coronal · coronal · 0.41mm/px · 3 of 93 slices shown]
[im 31/93  brain]
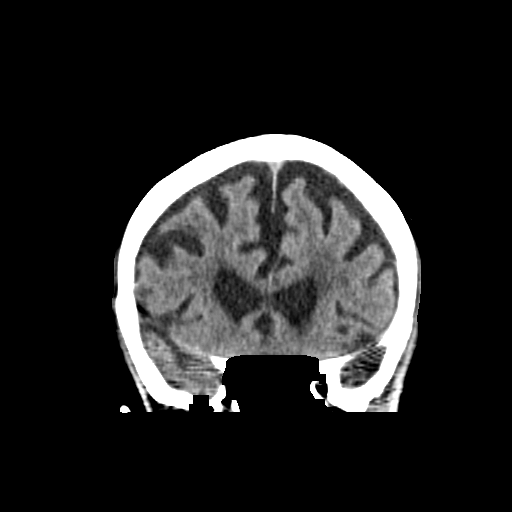
[im 41/93  brain]
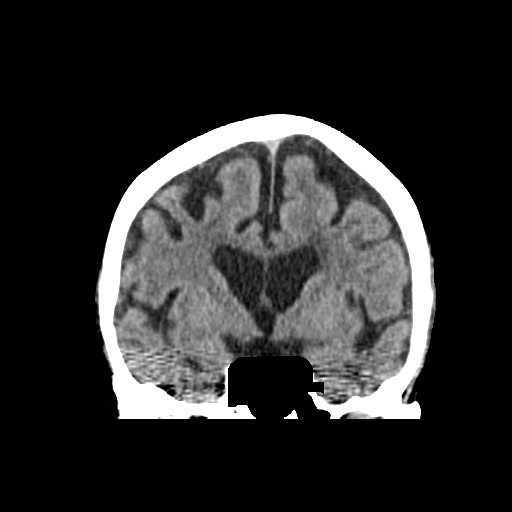
[im 52/93  brain]
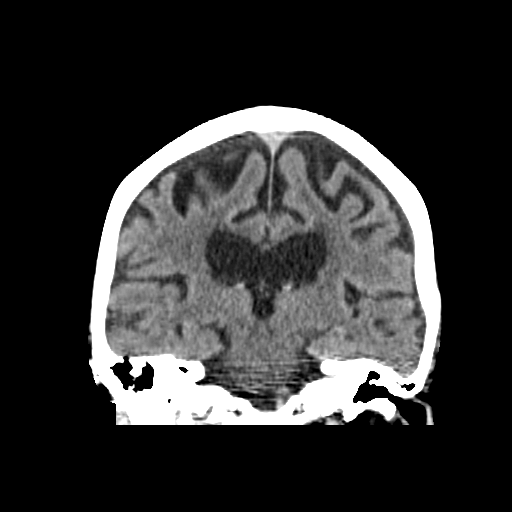

[15 of 38 positions shown; findings below may reference images not displayed]

FINDINGS: Moderate diffuse atrophy is stable. There is no intracranial mass,
hemorrhage, extra-axial fluid collection, or midline shift. There is
patchy small vessel disease in the centra semiovale bilaterally.
There is evidence of a prior small infarct in the mid right frontal
lobe. There is evidence of a prior infarct in the medial inferior
left occipital lobe. There is no new gray-white compartment lesion.
No acute infarct evident. The bony calvarium appears intact. The
visualized mastoid air cells are clear. Visualized orbits appear
symmetric bilaterally.
IMPRESSION: Atrophy with prior infarcts and patchy periventricular small vessel
disease. No acute infarct evident. No hemorrhage, mass, or
extra-axial fluid collection.
